# Patient Record
Sex: Female | Born: 1955
Health system: Southern US, Community
[De-identification: ages and names within clinical notes are randomized; demographics above are authoritative.]

## PROBLEM LIST (undated history)

## (undated) DIAGNOSIS — M810 Age-related osteoporosis without current pathological fracture: Secondary | ICD-10-CM

## (undated) DIAGNOSIS — M5136 Other intervertebral disc degeneration, lumbar region: Secondary | ICD-10-CM

## (undated) DIAGNOSIS — K222 Esophageal obstruction: Secondary | ICD-10-CM

## (undated) DIAGNOSIS — Z860100 Personal history of colon polyps, unspecified: Secondary | ICD-10-CM

## (undated) DIAGNOSIS — R569 Unspecified convulsions: Secondary | ICD-10-CM

## (undated) DIAGNOSIS — Z8673 Personal history of transient ischemic attack (TIA), and cerebral infarction without residual deficits: Secondary | ICD-10-CM

## (undated) DIAGNOSIS — E782 Mixed hyperlipidemia: Secondary | ICD-10-CM

## (undated) DIAGNOSIS — I2510 Atherosclerotic heart disease of native coronary artery without angina pectoris: Secondary | ICD-10-CM

## (undated) DIAGNOSIS — R1310 Dysphagia, unspecified: Secondary | ICD-10-CM

## (undated) DIAGNOSIS — F419 Anxiety disorder, unspecified: Secondary | ICD-10-CM

## (undated) DIAGNOSIS — C55 Malignant neoplasm of uterus, part unspecified: Secondary | ICD-10-CM

## (undated) DIAGNOSIS — M8580 Other specified disorders of bone density and structure, unspecified site: Secondary | ICD-10-CM

## (undated) DIAGNOSIS — C4491 Basal cell carcinoma of skin, unspecified: Secondary | ICD-10-CM

## (undated) DIAGNOSIS — I10 Essential (primary) hypertension: Secondary | ICD-10-CM

## (undated) DIAGNOSIS — K589 Irritable bowel syndrome without diarrhea: Secondary | ICD-10-CM

## (undated) DIAGNOSIS — N059 Unspecified nephritic syndrome with unspecified morphologic changes: Secondary | ICD-10-CM

## (undated) DIAGNOSIS — M8589 Other specified disorders of bone density and structure, multiple sites: Secondary | ICD-10-CM

## (undated) DIAGNOSIS — K219 Gastro-esophageal reflux disease without esophagitis: Secondary | ICD-10-CM

## (undated) DIAGNOSIS — M1990 Unspecified osteoarthritis, unspecified site: Secondary | ICD-10-CM

## (undated) DIAGNOSIS — M5030 Other cervical disc degeneration, unspecified cervical region: Secondary | ICD-10-CM

## (undated) DIAGNOSIS — M51369 Other intervertebral disc degeneration, lumbar region without mention of lumbar back pain or lower extremity pain: Secondary | ICD-10-CM

## (undated) HISTORY — DX: Anxiety disorder, unspecified: F41.9

## (undated) HISTORY — DX: Personal history of transient ischemic attack (TIA), and cerebral infarction without residual deficits: Z86.73

## (undated) HISTORY — DX: Esophageal obstruction: K22.2

## (undated) HISTORY — DX: Unspecified convulsions: R56.9

## (undated) HISTORY — PX: ABDOMINAL HYSTERECTOMY: SHX81

## (undated) HISTORY — DX: Other intervertebral disc degeneration, lumbar region: M51.36

## (undated) HISTORY — DX: Dysphagia, unspecified: R13.10

## (undated) HISTORY — DX: Irritable bowel syndrome, unspecified: K58.9

## (undated) HISTORY — DX: Gastro-esophageal reflux disease without esophagitis: K21.9

## (undated) HISTORY — DX: Basal cell carcinoma of skin, unspecified: C44.91

## (undated) HISTORY — DX: Unspecified osteoarthritis, unspecified site: M19.90

## (undated) HISTORY — DX: Personal history of colonic polyps: Z86.010

## (undated) HISTORY — DX: Age-related osteoporosis without current pathological fracture: M81.0

## (undated) HISTORY — PX: MELANOMA EXCISION: SHX5266

## (undated) HISTORY — DX: Other specified disorders of bone density and structure, unspecified site: M85.80

## (undated) HISTORY — DX: Mixed hyperlipidemia: E78.2

## (undated) HISTORY — PX: BACK SURGERY: SHX140

## (undated) HISTORY — DX: Personal history of colon polyps, unspecified: Z86.0100

## (undated) HISTORY — DX: Essential (primary) hypertension: I10

## (undated) HISTORY — DX: Other intervertebral disc degeneration, lumbar region without mention of lumbar back pain or lower extremity pain: M51.369

## (undated) HISTORY — PX: CHOLECYSTECTOMY: SHX55

## (undated) HISTORY — DX: Atherosclerotic heart disease of native coronary artery without angina pectoris: I25.10

## (undated) HISTORY — DX: Other cervical disc degeneration, unspecified cervical region: M50.30

## (undated) HISTORY — DX: Other specified disorders of bone density and structure, multiple sites: M85.89

## (undated) HISTORY — PX: BLADDER SURGERY: SHX569

## (undated) HISTORY — DX: Malignant neoplasm of uterus, part unspecified: C55

## (undated) HISTORY — DX: Unspecified nephritic syndrome with unspecified morphologic changes: N05.9

---

## 2007-10-14 HISTORY — PX: ESOPHAGOGASTRODUODENOSCOPY: SHX1529

## 2010-11-27 HISTORY — PX: NECK SURGERY: SHX720

## 2011-04-04 ENCOUNTER — Other Ambulatory Visit: Payer: Self-pay | Admitting: Neurosurgery

## 2011-04-04 ENCOUNTER — Ambulatory Visit
Admission: RE | Admit: 2011-04-04 | Discharge: 2011-04-04 | Disposition: A | Discharge status: Home / Self Care | Payer: PRIVATE HEALTH INSURANCE | Source: Ambulatory Visit | Attending: Neurosurgery | Admitting: Neurosurgery

## 2011-04-04 DIAGNOSIS — M542 Cervicalgia: Secondary | ICD-10-CM

## 2011-06-22 ENCOUNTER — Ambulatory Visit
Admission: RE | Admit: 2011-06-22 | Discharge: 2011-06-22 | Disposition: A | Discharge status: Home / Self Care | Payer: PRIVATE HEALTH INSURANCE | Source: Ambulatory Visit | Attending: Neurosurgery | Admitting: Neurosurgery

## 2011-06-22 ENCOUNTER — Other Ambulatory Visit: Payer: Self-pay | Admitting: Neurosurgery

## 2011-06-22 DIAGNOSIS — M542 Cervicalgia: Secondary | ICD-10-CM

## 2011-11-16 ENCOUNTER — Ambulatory Visit
Admission: RE | Admit: 2011-11-16 | Discharge: 2011-11-16 | Disposition: A | Discharge status: Home / Self Care | Payer: PRIVATE HEALTH INSURANCE | Source: Ambulatory Visit | Attending: Neurosurgery | Admitting: Neurosurgery

## 2011-11-16 ENCOUNTER — Other Ambulatory Visit: Payer: Self-pay | Admitting: Neurosurgery

## 2011-11-16 DIAGNOSIS — M542 Cervicalgia: Secondary | ICD-10-CM

## 2012-01-16 ENCOUNTER — Ambulatory Visit
Admission: RE | Admit: 2012-01-16 | Discharge: 2012-01-16 | Disposition: A | Discharge status: Home / Self Care | Payer: Commercial Managed Care - PPO | Source: Ambulatory Visit | Attending: Neurosurgery | Admitting: Neurosurgery

## 2012-01-16 DIAGNOSIS — M542 Cervicalgia: Secondary | ICD-10-CM

## 2012-04-16 ENCOUNTER — Other Ambulatory Visit: Payer: Self-pay | Admitting: Neurosurgery

## 2012-04-16 ENCOUNTER — Ambulatory Visit
Admission: RE | Admit: 2012-04-16 | Discharge: 2012-04-16 | Disposition: A | Discharge status: Home / Self Care | Payer: Commercial Managed Care - PPO | Source: Ambulatory Visit | Attending: Neurosurgery | Admitting: Neurosurgery

## 2012-04-16 DIAGNOSIS — M542 Cervicalgia: Secondary | ICD-10-CM

## 2012-04-16 DIAGNOSIS — M5030 Other cervical disc degeneration, unspecified cervical region: Secondary | ICD-10-CM

## 2012-07-06 IMAGING — CR DG CERVICAL SPINE 2 OR 3 VIEWS
4 series · 4 of 4 positions shown · non-contrast
Comparison: CT scan 01/16/2012.

CLINICAL DATA: Cervical fusion.

CERVICAL SPINE - 2-3 VIEW

[w c-spine lat (1 of 3)]
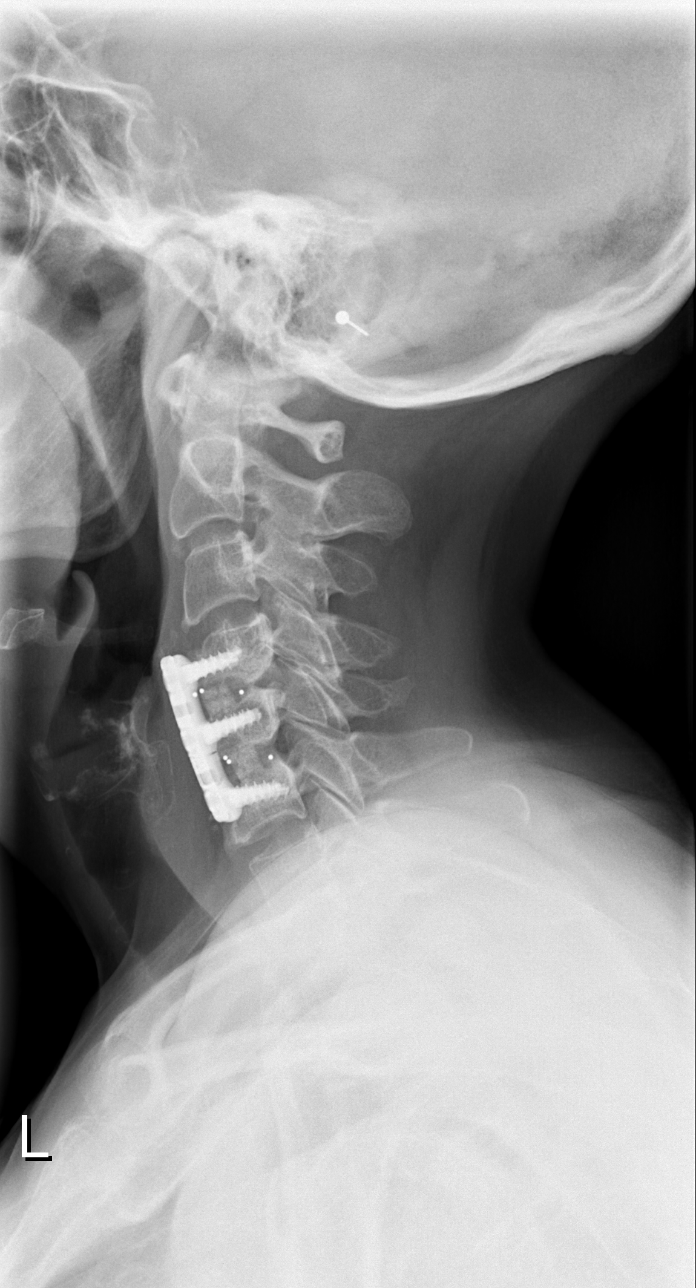

[w c-spine lat (2 of 3)]
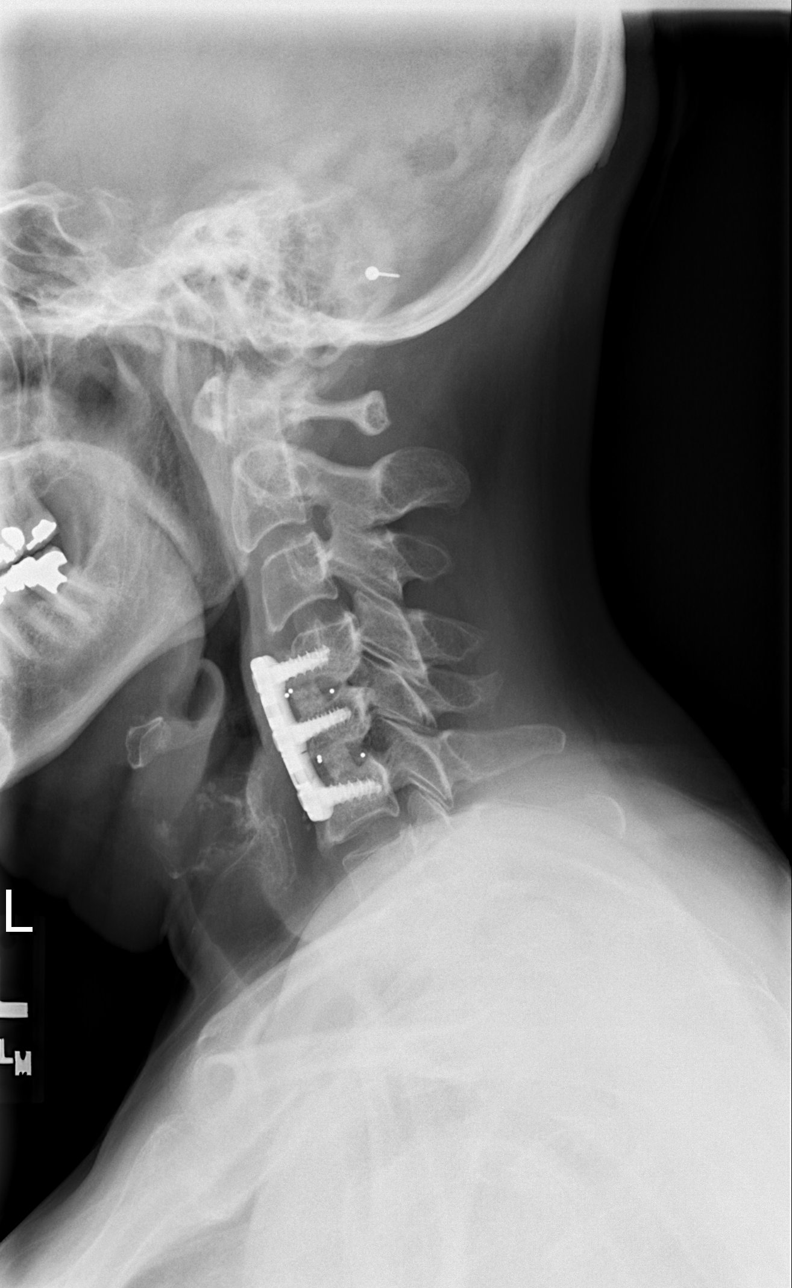

[w c-spine lat (3 of 3)]
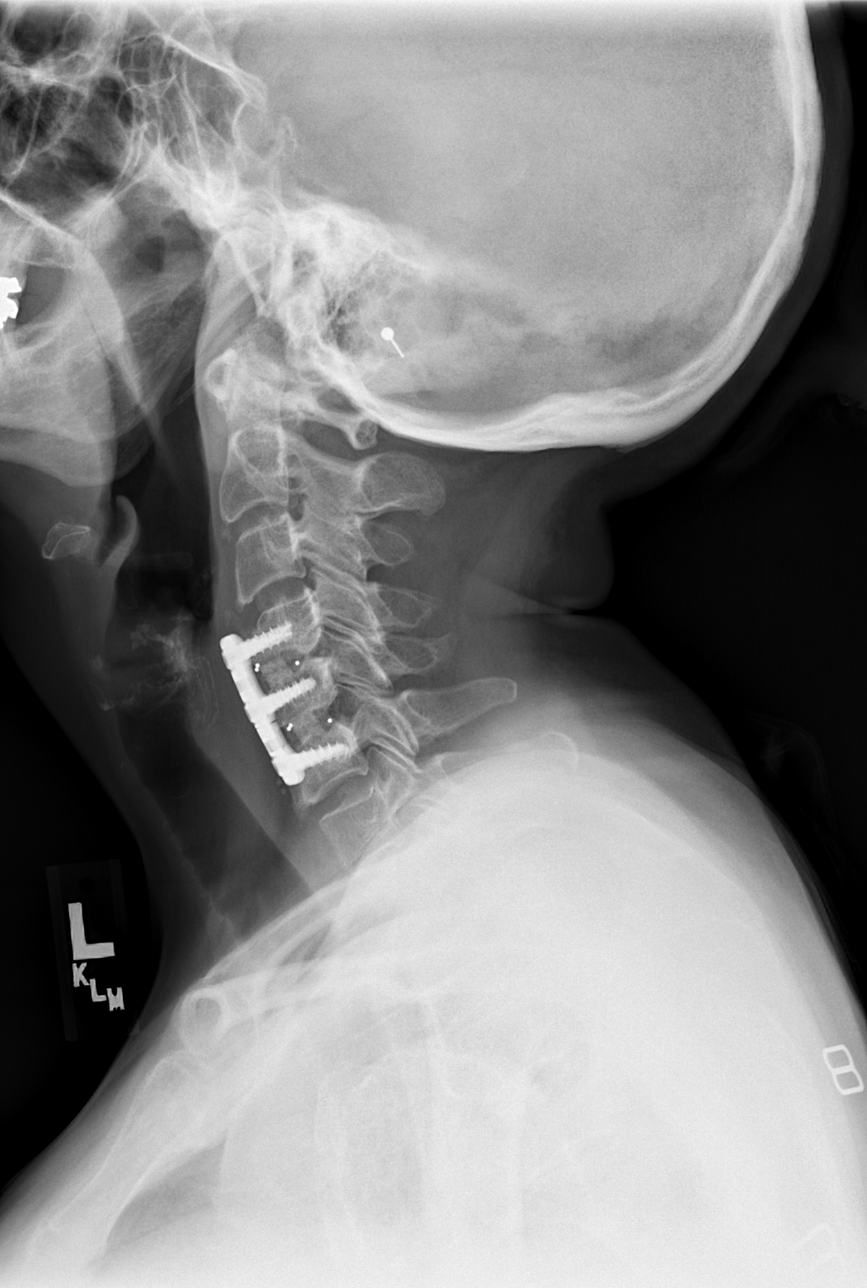

[w swimmers view *]
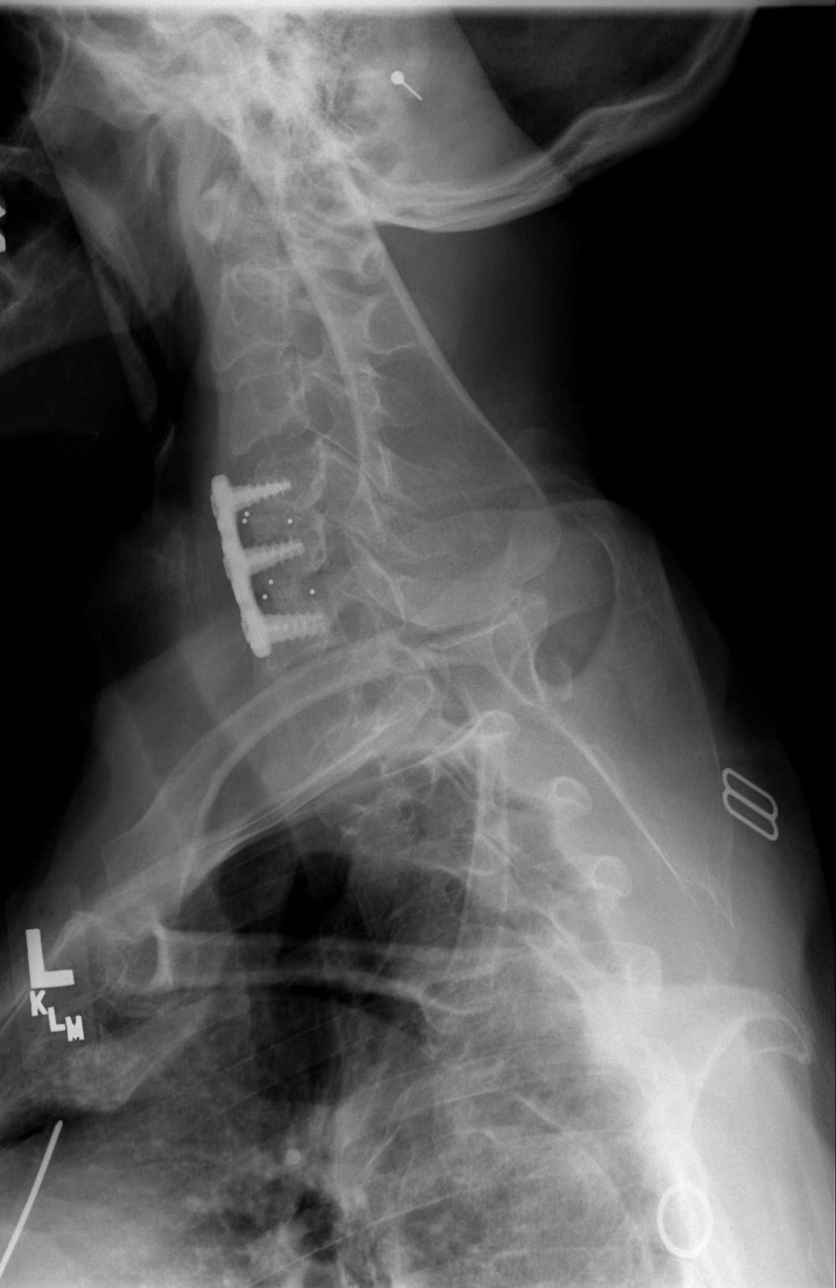

[4 of 4 positions shown; findings below may reference images not displayed]

FINDINGS: Anterior plate and screws and interbody bone spacers
fusing C4-5 and C5-6 appear stable.  No complicating features are
demonstrated.  The alignment is normal. Limited range of motion
with flexion/extension.  There is 2.5 mm of anterior subluxation of
C3 compared with C4 with flexion.  There is also slight widening of
the posterior disc space with flexion.
IMPRESSION: 1.  Stable fusion hardware.
2. Mild anterior subluxation of C3 compared with C4 with flexion.

## 2014-06-11 ENCOUNTER — Encounter: Payer: Self-pay | Admitting: Gastroenterology

## 2014-06-11 HISTORY — PX: COLONOSCOPY: SHX174

## 2016-05-24 DIAGNOSIS — Z85820 Personal history of malignant melanoma of skin: Secondary | ICD-10-CM | POA: Diagnosis not present

## 2016-05-24 DIAGNOSIS — Z08 Encounter for follow-up examination after completed treatment for malignant neoplasm: Secondary | ICD-10-CM | POA: Diagnosis not present

## 2016-05-24 DIAGNOSIS — L821 Other seborrheic keratosis: Secondary | ICD-10-CM | POA: Diagnosis not present

## 2016-05-24 DIAGNOSIS — D485 Neoplasm of uncertain behavior of skin: Secondary | ICD-10-CM | POA: Diagnosis not present

## 2016-09-26 DIAGNOSIS — M8589 Other specified disorders of bone density and structure, multiple sites: Secondary | ICD-10-CM | POA: Insufficient documentation

## 2016-09-26 DIAGNOSIS — Z8673 Personal history of transient ischemic attack (TIA), and cerebral infarction without residual deficits: Secondary | ICD-10-CM | POA: Insufficient documentation

## 2016-09-26 DIAGNOSIS — I10 Essential (primary) hypertension: Secondary | ICD-10-CM | POA: Insufficient documentation

## 2016-09-26 DIAGNOSIS — K2100 Gastro-esophageal reflux disease with esophagitis, without bleeding: Secondary | ICD-10-CM | POA: Insufficient documentation

## 2016-09-26 DIAGNOSIS — I6521 Occlusion and stenosis of right carotid artery: Secondary | ICD-10-CM | POA: Insufficient documentation

## 2016-09-27 DIAGNOSIS — Z0000 Encounter for general adult medical examination without abnormal findings: Secondary | ICD-10-CM | POA: Diagnosis not present

## 2016-09-27 DIAGNOSIS — Z01419 Encounter for gynecological examination (general) (routine) without abnormal findings: Secondary | ICD-10-CM | POA: Diagnosis not present

## 2016-09-27 DIAGNOSIS — Z1231 Encounter for screening mammogram for malignant neoplasm of breast: Secondary | ICD-10-CM | POA: Diagnosis not present

## 2016-09-27 DIAGNOSIS — E782 Mixed hyperlipidemia: Secondary | ICD-10-CM | POA: Diagnosis not present

## 2016-09-27 DIAGNOSIS — M8580 Other specified disorders of bone density and structure, unspecified site: Secondary | ICD-10-CM | POA: Diagnosis not present

## 2016-09-27 DIAGNOSIS — F419 Anxiety disorder, unspecified: Secondary | ICD-10-CM | POA: Diagnosis not present

## 2016-09-27 DIAGNOSIS — I10 Essential (primary) hypertension: Secondary | ICD-10-CM | POA: Diagnosis not present

## 2016-09-27 DIAGNOSIS — R5381 Other malaise: Secondary | ICD-10-CM | POA: Diagnosis not present

## 2016-09-27 DIAGNOSIS — K210 Gastro-esophageal reflux disease with esophagitis: Secondary | ICD-10-CM | POA: Diagnosis not present

## 2016-09-27 DIAGNOSIS — G459 Transient cerebral ischemic attack, unspecified: Secondary | ICD-10-CM | POA: Diagnosis not present

## 2016-09-27 DIAGNOSIS — I6523 Occlusion and stenosis of bilateral carotid arteries: Secondary | ICD-10-CM | POA: Diagnosis not present

## 2016-09-27 DIAGNOSIS — R5383 Other fatigue: Secondary | ICD-10-CM | POA: Diagnosis not present

## 2016-10-11 DIAGNOSIS — K210 Gastro-esophageal reflux disease with esophagitis: Secondary | ICD-10-CM | POA: Diagnosis not present

## 2016-10-11 DIAGNOSIS — Z1231 Encounter for screening mammogram for malignant neoplasm of breast: Secondary | ICD-10-CM | POA: Diagnosis not present

## 2016-10-11 DIAGNOSIS — K648 Other hemorrhoids: Secondary | ICD-10-CM | POA: Diagnosis not present

## 2016-10-11 DIAGNOSIS — I6523 Occlusion and stenosis of bilateral carotid arteries: Secondary | ICD-10-CM | POA: Diagnosis not present

## 2016-10-11 DIAGNOSIS — M791 Myalgia: Secondary | ICD-10-CM | POA: Diagnosis not present

## 2016-10-11 DIAGNOSIS — K921 Melena: Secondary | ICD-10-CM | POA: Diagnosis not present

## 2016-10-11 DIAGNOSIS — E782 Mixed hyperlipidemia: Secondary | ICD-10-CM | POA: Diagnosis not present

## 2016-11-13 DIAGNOSIS — J324 Chronic pansinusitis: Secondary | ICD-10-CM | POA: Diagnosis not present

## 2016-11-13 DIAGNOSIS — I10 Essential (primary) hypertension: Secondary | ICD-10-CM | POA: Diagnosis not present

## 2016-11-13 DIAGNOSIS — M79671 Pain in right foot: Secondary | ICD-10-CM | POA: Diagnosis not present

## 2016-11-14 DIAGNOSIS — M79671 Pain in right foot: Secondary | ICD-10-CM | POA: Diagnosis not present

## 2016-11-14 DIAGNOSIS — M7989 Other specified soft tissue disorders: Secondary | ICD-10-CM | POA: Diagnosis not present

## 2016-11-17 DIAGNOSIS — R609 Edema, unspecified: Secondary | ICD-10-CM | POA: Diagnosis not present

## 2016-11-17 DIAGNOSIS — M79671 Pain in right foot: Secondary | ICD-10-CM | POA: Diagnosis not present

## 2016-11-17 DIAGNOSIS — R600 Localized edema: Secondary | ICD-10-CM | POA: Diagnosis not present

## 2017-06-20 DIAGNOSIS — I10 Essential (primary) hypertension: Secondary | ICD-10-CM | POA: Diagnosis not present

## 2017-06-20 DIAGNOSIS — G459 Transient cerebral ischemic attack, unspecified: Secondary | ICD-10-CM | POA: Diagnosis not present

## 2017-06-20 DIAGNOSIS — I6523 Occlusion and stenosis of bilateral carotid arteries: Secondary | ICD-10-CM | POA: Diagnosis not present

## 2017-06-20 DIAGNOSIS — K210 Gastro-esophageal reflux disease with esophagitis: Secondary | ICD-10-CM | POA: Diagnosis not present

## 2017-08-01 DIAGNOSIS — Z85828 Personal history of other malignant neoplasm of skin: Secondary | ICD-10-CM | POA: Diagnosis not present

## 2017-08-01 DIAGNOSIS — Z85820 Personal history of malignant melanoma of skin: Secondary | ICD-10-CM | POA: Diagnosis not present

## 2017-08-01 DIAGNOSIS — Z08 Encounter for follow-up examination after completed treatment for malignant neoplasm: Secondary | ICD-10-CM | POA: Diagnosis not present

## 2017-10-23 DIAGNOSIS — J40 Bronchitis, not specified as acute or chronic: Secondary | ICD-10-CM | POA: Diagnosis not present

## 2017-10-23 DIAGNOSIS — I10 Essential (primary) hypertension: Secondary | ICD-10-CM | POA: Diagnosis not present

## 2017-11-22 DIAGNOSIS — J0191 Acute recurrent sinusitis, unspecified: Secondary | ICD-10-CM | POA: Diagnosis not present

## 2017-11-22 DIAGNOSIS — I6529 Occlusion and stenosis of unspecified carotid artery: Secondary | ICD-10-CM | POA: Diagnosis not present

## 2017-11-22 DIAGNOSIS — J310 Chronic rhinitis: Secondary | ICD-10-CM | POA: Diagnosis not present

## 2017-11-22 DIAGNOSIS — I10 Essential (primary) hypertension: Secondary | ICD-10-CM | POA: Diagnosis not present

## 2017-12-26 DIAGNOSIS — R5381 Other malaise: Secondary | ICD-10-CM | POA: Diagnosis not present

## 2017-12-26 DIAGNOSIS — Z124 Encounter for screening for malignant neoplasm of cervix: Secondary | ICD-10-CM | POA: Diagnosis not present

## 2017-12-26 DIAGNOSIS — R5383 Other fatigue: Secondary | ICD-10-CM | POA: Diagnosis not present

## 2017-12-26 DIAGNOSIS — K210 Gastro-esophageal reflux disease with esophagitis: Secondary | ICD-10-CM | POA: Diagnosis not present

## 2017-12-26 DIAGNOSIS — E782 Mixed hyperlipidemia: Secondary | ICD-10-CM | POA: Diagnosis not present

## 2017-12-26 DIAGNOSIS — Z0000 Encounter for general adult medical examination without abnormal findings: Secondary | ICD-10-CM | POA: Diagnosis not present

## 2017-12-26 DIAGNOSIS — I10 Essential (primary) hypertension: Secondary | ICD-10-CM | POA: Diagnosis not present

## 2017-12-26 DIAGNOSIS — I6529 Occlusion and stenosis of unspecified carotid artery: Secondary | ICD-10-CM | POA: Diagnosis not present

## 2018-02-06 DIAGNOSIS — M8580 Other specified disorders of bone density and structure, unspecified site: Secondary | ICD-10-CM | POA: Diagnosis not present

## 2018-02-06 DIAGNOSIS — M8589 Other specified disorders of bone density and structure, multiple sites: Secondary | ICD-10-CM | POA: Diagnosis not present

## 2018-02-06 DIAGNOSIS — Z1231 Encounter for screening mammogram for malignant neoplasm of breast: Secondary | ICD-10-CM | POA: Diagnosis not present

## 2018-02-06 DIAGNOSIS — Z780 Asymptomatic menopausal state: Secondary | ICD-10-CM | POA: Diagnosis not present

## 2018-08-14 DIAGNOSIS — Z85820 Personal history of malignant melanoma of skin: Secondary | ICD-10-CM | POA: Diagnosis not present

## 2018-08-14 DIAGNOSIS — L820 Inflamed seborrheic keratosis: Secondary | ICD-10-CM | POA: Diagnosis not present

## 2018-08-14 DIAGNOSIS — Z85828 Personal history of other malignant neoplasm of skin: Secondary | ICD-10-CM | POA: Diagnosis not present

## 2018-08-14 DIAGNOSIS — Z08 Encounter for follow-up examination after completed treatment for malignant neoplasm: Secondary | ICD-10-CM | POA: Diagnosis not present

## 2018-08-14 DIAGNOSIS — L700 Acne vulgaris: Secondary | ICD-10-CM | POA: Diagnosis not present

## 2018-09-04 DIAGNOSIS — K210 Gastro-esophageal reflux disease with esophagitis: Secondary | ICD-10-CM | POA: Diagnosis not present

## 2018-09-04 DIAGNOSIS — I6521 Occlusion and stenosis of right carotid artery: Secondary | ICD-10-CM | POA: Diagnosis not present

## 2018-09-04 DIAGNOSIS — R5381 Other malaise: Secondary | ICD-10-CM | POA: Diagnosis not present

## 2018-09-04 DIAGNOSIS — M791 Myalgia, unspecified site: Secondary | ICD-10-CM | POA: Diagnosis not present

## 2018-09-04 DIAGNOSIS — F419 Anxiety disorder, unspecified: Secondary | ICD-10-CM | POA: Diagnosis not present

## 2018-09-04 DIAGNOSIS — M5136 Other intervertebral disc degeneration, lumbar region: Secondary | ICD-10-CM | POA: Diagnosis not present

## 2018-09-04 DIAGNOSIS — E782 Mixed hyperlipidemia: Secondary | ICD-10-CM | POA: Diagnosis not present

## 2018-09-04 DIAGNOSIS — Z0000 Encounter for general adult medical examination without abnormal findings: Secondary | ICD-10-CM | POA: Diagnosis not present

## 2018-09-04 DIAGNOSIS — I10 Essential (primary) hypertension: Secondary | ICD-10-CM | POA: Diagnosis not present

## 2018-09-04 DIAGNOSIS — Z8673 Personal history of transient ischemic attack (TIA), and cerebral infarction without residual deficits: Secondary | ICD-10-CM | POA: Diagnosis not present

## 2018-09-04 DIAGNOSIS — M5030 Other cervical disc degeneration, unspecified cervical region: Secondary | ICD-10-CM | POA: Diagnosis not present

## 2018-09-04 DIAGNOSIS — R5383 Other fatigue: Secondary | ICD-10-CM | POA: Diagnosis not present

## 2018-09-04 DIAGNOSIS — Z780 Asymptomatic menopausal state: Secondary | ICD-10-CM | POA: Diagnosis not present

## 2018-09-04 DIAGNOSIS — M8589 Other specified disorders of bone density and structure, multiple sites: Secondary | ICD-10-CM | POA: Diagnosis not present

## 2019-07-16 ENCOUNTER — Encounter: Payer: Self-pay | Admitting: Gastroenterology

## 2019-08-27 DIAGNOSIS — Z85828 Personal history of other malignant neoplasm of skin: Secondary | ICD-10-CM | POA: Diagnosis not present

## 2019-08-27 DIAGNOSIS — L700 Acne vulgaris: Secondary | ICD-10-CM | POA: Diagnosis not present

## 2019-08-27 DIAGNOSIS — D485 Neoplasm of uncertain behavior of skin: Secondary | ICD-10-CM | POA: Diagnosis not present

## 2019-08-27 DIAGNOSIS — Z08 Encounter for follow-up examination after completed treatment for malignant neoplasm: Secondary | ICD-10-CM | POA: Diagnosis not present

## 2019-08-27 DIAGNOSIS — Z85820 Personal history of malignant melanoma of skin: Secondary | ICD-10-CM | POA: Diagnosis not present

## 2019-08-27 DIAGNOSIS — D2271 Melanocytic nevi of right lower limb, including hip: Secondary | ICD-10-CM | POA: Diagnosis not present

## 2019-09-10 DIAGNOSIS — M791 Myalgia, unspecified site: Secondary | ICD-10-CM | POA: Diagnosis not present

## 2019-09-10 DIAGNOSIS — K2100 Gastro-esophageal reflux disease with esophagitis, without bleeding: Secondary | ICD-10-CM | POA: Diagnosis not present

## 2019-09-10 DIAGNOSIS — R5381 Other malaise: Secondary | ICD-10-CM | POA: Diagnosis not present

## 2019-09-10 DIAGNOSIS — I6521 Occlusion and stenosis of right carotid artery: Secondary | ICD-10-CM | POA: Diagnosis not present

## 2019-09-10 DIAGNOSIS — F419 Anxiety disorder, unspecified: Secondary | ICD-10-CM | POA: Diagnosis not present

## 2019-09-10 DIAGNOSIS — M8589 Other specified disorders of bone density and structure, multiple sites: Secondary | ICD-10-CM | POA: Diagnosis not present

## 2019-09-10 DIAGNOSIS — Z23 Encounter for immunization: Secondary | ICD-10-CM | POA: Diagnosis not present

## 2019-09-10 DIAGNOSIS — M5030 Other cervical disc degeneration, unspecified cervical region: Secondary | ICD-10-CM | POA: Diagnosis not present

## 2019-09-10 DIAGNOSIS — R5383 Other fatigue: Secondary | ICD-10-CM | POA: Diagnosis not present

## 2019-09-10 DIAGNOSIS — M5136 Other intervertebral disc degeneration, lumbar region: Secondary | ICD-10-CM | POA: Diagnosis not present

## 2019-09-10 DIAGNOSIS — E782 Mixed hyperlipidemia: Secondary | ICD-10-CM | POA: Diagnosis not present

## 2019-09-10 DIAGNOSIS — Z0000 Encounter for general adult medical examination without abnormal findings: Secondary | ICD-10-CM | POA: Diagnosis not present

## 2019-09-10 DIAGNOSIS — Z8673 Personal history of transient ischemic attack (TIA), and cerebral infarction without residual deficits: Secondary | ICD-10-CM | POA: Diagnosis not present

## 2019-09-10 DIAGNOSIS — I10 Essential (primary) hypertension: Secondary | ICD-10-CM | POA: Diagnosis not present

## 2019-09-12 DIAGNOSIS — Z23 Encounter for immunization: Secondary | ICD-10-CM | POA: Diagnosis not present

## 2020-02-09 DIAGNOSIS — F419 Anxiety disorder, unspecified: Secondary | ICD-10-CM | POA: Diagnosis not present

## 2020-02-09 DIAGNOSIS — K2100 Gastro-esophageal reflux disease with esophagitis, without bleeding: Secondary | ICD-10-CM | POA: Diagnosis not present

## 2020-02-09 DIAGNOSIS — I10 Essential (primary) hypertension: Secondary | ICD-10-CM | POA: Diagnosis not present

## 2020-02-09 DIAGNOSIS — J3489 Other specified disorders of nose and nasal sinuses: Secondary | ICD-10-CM | POA: Diagnosis not present

## 2020-02-11 ENCOUNTER — Encounter: Payer: Self-pay | Admitting: Gastroenterology

## 2020-03-23 ENCOUNTER — Other Ambulatory Visit: Payer: Self-pay

## 2020-03-23 ENCOUNTER — Ambulatory Visit: Payer: Commercial Managed Care - PPO | Admitting: Gastroenterology

## 2020-03-23 ENCOUNTER — Encounter: Payer: Self-pay | Admitting: Gastroenterology

## 2020-03-23 VITALS — BP 148/92 | HR 65 | Temp 97.8°F | Ht 62.0 in | Wt 145.5 lb

## 2020-03-23 DIAGNOSIS — Z86010 Personal history of colonic polyps: Secondary | ICD-10-CM

## 2020-03-23 DIAGNOSIS — K625 Hemorrhage of anus and rectum: Secondary | ICD-10-CM

## 2020-03-23 DIAGNOSIS — R1310 Dysphagia, unspecified: Secondary | ICD-10-CM

## 2020-03-23 DIAGNOSIS — R197 Diarrhea, unspecified: Secondary | ICD-10-CM

## 2020-03-23 DIAGNOSIS — R1319 Other dysphagia: Secondary | ICD-10-CM

## 2020-03-23 NOTE — Progress Notes (Signed)
Chief Complaint: GERD/dysphagia/intermittent diarrhea  Referring Provider:  Raina Mina., MD      ASSESSMENT AND PLAN;   #1.  GERD with HH with eso dysphagia  #2.  IBS-D with occ rectal bleeding. Nl CBC. Has associated post-cholecystectomy diarrhea  #3.  Previous history of colonic polyps.  Plan: -EGD with dilatation/colonoscopy for further evaluation of discussed risks and benefits. -We will continue omeprazole 40 mg p.o. every morning. -Continue Pepcid on a as needed basis. -Nonpharmacologic means of reflux control was stressed. -If the diarrhea persists, will give her a trial of cholestyramine.   HPI:    Kathleen Mayo is a 64 y.o. female  With dysphagia mostly to solids, getting worse over the last 6 months.  She will be having increased acid reflux.  Has been taking omeprazole 40 mg p.o. once a day.  Seen by Blanch Media, added Pepcid with good results.  Also complains of intermittent diarrhea which at times gets worse. 4-6/day, postprandial without nocturnal symptoms.  No melena.  Had occasional hematochezia mostly when she wipes on the tissue paper.  Denies having any significant abdominal pain.  No fever chills or night sweats no recent weight loss.  Has "metal plate" in the neck and was concerned if that is causing problems.  She also has chronic back pain due to severe osteoarthritis and is contemplating back surgery.  No sodas, chocolates, chewing gums, artificial sweeteners and candy. No NSAIDs   Past GI procedures: -Colon 05/2014 ([facility]-some difficulty through the sigmoid colon) mild sigmoid diverticulosis otherwise normal to TI. 2010-tubular adenomas. -EGD 09/2007 Schatzki's ring s/p dilatation 5044, mild gastritis, irregular Z-line. Neg Bx for EoE/HP  Past Medical History:  Diagnosis Date  . Arthritis   . Basal cell carcinoma   . CAD (coronary artery disease)   . DDD (degenerative disc disease), cervical   . DDD (degenerative disc disease), lumbar   .  Dysphagia   . Essential hypertension   . GERD (gastroesophageal reflux disease)   . History of colon polyps   . History of TIA (transient ischemic attack)   . IBS (irritable bowel syndrome)   . Mixed hyperlipidemia   . Osteopenia   . Osteopenia of multiple sites   . Schatzki's ring   . Seizures ([place])   . Uterine cancer [facility])     Past Surgical History:  Procedure Laterality Date  . ABDOMINAL HYSTERECTOMY     age 56  . BACK SURGERY     2 protruding disc and 6 spurs  . BLADDER SURGERY     x2  . CHOLECYSTECTOMY    . COLONOSCOPY  06/11/2014   Mild diverticulosis. Otherwise normal colonoscopy  . ESOPHAGOGASTRODUODENOSCOPY  10/14/2007   Sxhatzki ring status post esophageal dilatation. Mildy irregular Z-Line suggestive of gastroesophageal reflux (biopsied). Mild gastritis.   Marland Kitchen NECK SURGERY  2012   has a metal plate in neck with scar tissue    Family History  Problem Relation Age of Onset  . Breast cancer Mother        2 different kinds per patient  . Pancreatic cancer Cousin   . Esophageal cancer Neg Hx     Social History   Tobacco Use  . Smoking status: Former Research scientist (life sciences)  . Smokeless tobacco: Never Used  Substance Use Topics  . Alcohol use: Not Currently  . Drug use: Not Currently    Current Outpatient Medications  Medication Sig Dispense Refill  . Ascorbic Acid (VITAMIN C PO) Take 1 tablet by mouth daily.    Marland Kitchen  BIOTIN PO Take 1 tablet by mouth daily.    . calcium carbonate (OSCAL) 1500 (600 Ca) MG TABS tablet Take 1,200 mg of elemental calcium by mouth daily.    . famotidine (PEPCID) 20 MG tablet Take 1 tablet by mouth daily.    . hyoscyamine (LEVSIN SL) 0.125 MG SL tablet Take 1 tablet by mouth every 4 (four) hours as needed.    Marland Kitchen losartan (COZAAR) 25 MG tablet 1 tablet daily.    . Multiple Vitamin (MULTIVITAMIN) capsule Take 1 capsule by mouth daily.    . Multiple Vitamins-Minerals (ICAPS) CAPS Take 1 capsule by mouth daily.    Marland Kitchen omeprazole (PRILOSEC) 40 MG capsule  Take 40 mg by mouth daily.    . simvastatin (ZOCOR) 10 MG tablet 1 tablet as directed. 1 to 3 times a week     No current facility-administered medications for this visit.    Allergies  Allergen Reactions  . Sulfamethoxazole Anaphylaxis    Review of Systems:  Constitutional: Denies fever, chills, diaphoresis, appetite change and fatigue.  HEENT: Denies photophobia, eye pain, redness, hearing loss, ear pain, congestion, sore throat, rhinorrhea, sneezing, mouth sores, neck pain, neck stiffness and tinnitus.   Respiratory: Denies SOB, DOE, cough, chest tightness,  and wheezing.   Cardiovascular: Denies chest pain, palpitations and leg swelling.  Genitourinary: Denies dysuria, urgency, frequency, hematuria, flank pain and difficulty urinating.  Musculoskeletal: Has myalgias, back pain, joint swelling, arthralgias and gait problem.  Skin: No rash.  Neurological: Denies dizziness, seizures, syncope, weakness, light-headedness, numbness and headaches.  Hematological: Denies adenopathy. Easy bruising, personal or family bleeding history  Psychiatric/Behavioral: Has anxiety or depression     Physical Exam:    BP (!) 148/92   Pulse 65   Temp 97.8 F (36.6 C)   Ht 5\' 2"  (1.575 m)   Wt 145 lb 8 oz (66 kg)   BMI 26.61 kg/m  Wt Readings from Last 3 Encounters:  03/23/20 145 lb 8 oz (66 kg)   Constitutional:  Well-developed, in no acute distress. Psychiatric: Normal mood and affect. Behavior is normal. HEENT: Pupils normal.  Conjunctivae are normal. No scleral icterus. Neck supple.  Cardiovascular: Normal rate, regular rhythm. No edema Pulmonary/chest: Effort normal and bilateral breath sounds are reduced.. No wheezing, rales or rhonchi. Abdominal: Soft, nondistended. Nontender. Bowel sounds active throughout. There are no masses palpable. No hepatomegaly. Rectal:  defered Neurological: Alert and oriented to person place and time. Skin: Skin is warm and dry. No rashes noted.  Data  Reviewed: I have personally reviewed following labs and imaging studies    Carmell Austria, MD 03/23/2020, 2:04 PM  Cc: Raina Mina., MD

## 2020-03-23 NOTE — Patient Instructions (Addendum)
If you are age 64 or older, your body mass index should be between 23-30. Your Body mass index is 26.61 kg/m. If this is out of the aforementioned range listed, please consider follow up with your Primary Care Provider.  If you are age 64 or younger, your body mass index should be between 19-25. Your Body mass index is 26.61 kg/m. If this is out of the aformentioned range listed, please consider follow up with your Primary Care Provider.   It has been recommended to you by your physician that you have a(n) Colonoscopy/EGD completed. Per your request, we did not schedule the procedure(s) today. Please contact our office at [phone] should you decide to have the procedure completed. You will be scheduled for a pre-visit and procedure at that time.   Thank you,  Dr. Jackquline Denmark

## 2020-03-26 ENCOUNTER — Encounter: Payer: Self-pay | Admitting: Gastroenterology

## 2020-04-22 ENCOUNTER — Other Ambulatory Visit: Payer: Self-pay

## 2020-04-22 ENCOUNTER — Ambulatory Visit (AMBULATORY_SURGERY_CENTER): Payer: Self-pay | Admitting: *Deleted

## 2020-04-22 VITALS — Ht 62.0 in | Wt 144.0 lb

## 2020-04-22 DIAGNOSIS — Z86010 Personal history of colonic polyps: Secondary | ICD-10-CM

## 2020-04-22 DIAGNOSIS — K209 Esophagitis, unspecified without bleeding: Secondary | ICD-10-CM

## 2020-04-22 NOTE — Progress Notes (Signed)

## 2020-04-28 ENCOUNTER — Encounter: Payer: Self-pay | Admitting: Gastroenterology

## 2020-05-05 DIAGNOSIS — M8589 Other specified disorders of bone density and structure, multiple sites: Secondary | ICD-10-CM | POA: Diagnosis not present

## 2020-05-05 DIAGNOSIS — Z1231 Encounter for screening mammogram for malignant neoplasm of breast: Secondary | ICD-10-CM | POA: Diagnosis not present

## 2020-05-10 ENCOUNTER — Encounter: Payer: PPO | Admitting: Gastroenterology

## 2020-05-10 ENCOUNTER — Other Ambulatory Visit: Payer: Self-pay

## 2020-05-10 ENCOUNTER — Encounter: Payer: Self-pay | Admitting: Gastroenterology

## 2020-05-10 ENCOUNTER — Ambulatory Visit (AMBULATORY_SURGERY_CENTER): Payer: PPO | Admitting: Gastroenterology

## 2020-05-10 VITALS — BP 149/82 | HR 47 | Temp 97.1°F | Resp 19 | Ht 62.0 in | Wt 144.0 lb

## 2020-05-10 DIAGNOSIS — R1310 Dysphagia, unspecified: Secondary | ICD-10-CM | POA: Diagnosis not present

## 2020-05-10 DIAGNOSIS — K2970 Gastritis, unspecified, without bleeding: Secondary | ICD-10-CM

## 2020-05-10 DIAGNOSIS — R1319 Other dysphagia: Secondary | ICD-10-CM

## 2020-05-10 DIAGNOSIS — Z86010 Personal history of colonic polyps: Secondary | ICD-10-CM

## 2020-05-10 DIAGNOSIS — K219 Gastro-esophageal reflux disease without esophagitis: Secondary | ICD-10-CM

## 2020-05-10 DIAGNOSIS — K2990 Gastroduodenitis, unspecified, without bleeding: Secondary | ICD-10-CM

## 2020-05-10 DIAGNOSIS — D122 Benign neoplasm of ascending colon: Secondary | ICD-10-CM | POA: Diagnosis not present

## 2020-05-10 DIAGNOSIS — K222 Esophageal obstruction: Secondary | ICD-10-CM | POA: Diagnosis not present

## 2020-05-10 DIAGNOSIS — K317 Polyp of stomach and duodenum: Secondary | ICD-10-CM | POA: Diagnosis not present

## 2020-05-10 DIAGNOSIS — K209 Esophagitis, unspecified without bleeding: Secondary | ICD-10-CM

## 2020-05-10 DIAGNOSIS — K259 Gastric ulcer, unspecified as acute or chronic, without hemorrhage or perforation: Secondary | ICD-10-CM | POA: Diagnosis not present

## 2020-05-10 MED ORDER — SODIUM CHLORIDE 0.9 % IV SOLN: 500.0000 mL | Freq: Once | INTRAVENOUS | Status: DC

## 2020-05-10 NOTE — Op Note (Signed)
[facility] Patient Name: Kathleen Mayo Procedure Date: 05/10/2020 8:36 AM MRN: [id number] Endoscopist: Jackquline Denmark , MD Age: 64 Referring MD:  Date of Birth: 1956-05-18 Gender: Female Account #: [id number] Procedure:                Colonoscopy Indications:              High risk colon cancer surveillance: Personal                            history of colonic polyps Medicines:                Monitored Anesthesia Care Procedure:                Pre-Anesthesia Assessment:                           - Prior to the procedure, a History and Physical                            was performed, and patient medications and                            allergies were reviewed. The patient's tolerance of                            previous anesthesia was also reviewed. The risks                            and benefits of the procedure and the sedation                            options and risks were discussed with the patient.                            All questions were answered, and informed consent                            was obtained. Prior Anticoagulants: The patient has                            taken no previous anticoagulant or antiplatelet                            agents. ASA Grade Assessment: III - A patient with                            severe systemic disease. After reviewing the risks                            and benefits, the patient was deemed in                            satisfactory condition to undergo the procedure.  After obtaining informed consent, the colonoscope                            was passed under direct vision. Throughout the                            procedure, the patient's blood pressure, pulse, and                            oxygen saturations were monitored continuously. The                            Colonoscope was introduced through the anus and                            advanced to the the cecum, identified  by                            appendiceal orifice and ileocecal valve. The                            colonoscopy was performed without difficulty. The                            patient tolerated the procedure well. The quality                            of the bowel preparation was good except in the                            right side of the colon where there was some                            adherent stool. The ileocecal valve, appendiceal                            orifice, and rectum were photographed. Scope In: 8:59:16 AM Scope Out: 9:17:02 AM Scope Withdrawal Time: 0 hours 10 minutes 51 seconds  Total Procedure Duration: 0 hours 17 minutes 46 seconds  Findings:                 A 6 mm polyp was found in the proximal ascending                            colon. The polyp was sessile. The polyp was removed                            with a cold snare. Resection and retrieval were                            complete.                           A few small-mouthed rare diverticula were found  in                            the sigmoid colon.                           Non-bleeding internal hemorrhoids were found during                            retroflexion. The hemorrhoids were small.                           The exam was otherwise without abnormality on                            direct and retroflexion views. Complications:            No immediate complications. Estimated Blood Loss:     Estimated blood loss: none. Impression:               - One 6 mm polyp in the proximal ascending colon,                            removed with a cold snare. Resected and retrieved.                           - Minimal sigmoid diverticulosis.                           - Non-bleeding internal hemorrhoids.                           - The examination was otherwise normal on direct                            and retroflexion views. Recommendation:           - Patient has a contact number available for                             emergencies. The signs and symptoms of potential                            delayed complications were discussed with the                            patient. Return to normal activities tomorrow.                            Written discharge instructions were provided to the                            patient.                           - Resume previous diet.                           -  Continue present medications.                           - Await pathology results.                           - Repeat colonoscopy for surveillance based on                            pathology results.                           - The findings and recommendations were discussed                            with the patient's brother Heath Lark. Jackquline Denmark, MD 05/10/2020 9:27:53 AM This report has been signed electronically.

## 2020-05-10 NOTE — Op Note (Signed)
[facility] Patient Name: Kathleen Mayo Procedure Date: 05/10/2020 8:36 AM MRN: [id number] Endoscopist: Jackquline Denmark , MD Age: 64 Referring MD:  Date of Birth: 10-16-1956 Gender: Female Account #: [id number] Procedure:                Upper GI endoscopy Indications:              Dysphagia Medicines:                Monitored Anesthesia Care Procedure:                Pre-Anesthesia Assessment:                           - Prior to the procedure, a History and Physical                            was performed, and patient medications and                            allergies were reviewed. The patient's tolerance of                            previous anesthesia was also reviewed. The risks                            and benefits of the procedure and the sedation                            options and risks were discussed with the patient.                            All questions were answered, and informed consent                            was obtained. Prior Anticoagulants: The patient has                            taken no previous anticoagulant or antiplatelet                            agents. ASA Grade Assessment: III - A patient with                            severe systemic disease. After reviewing the risks                            and benefits, the patient was deemed in                            satisfactory condition to undergo the procedure.                           After obtaining informed consent, the endoscope was  passed under direct vision. Throughout the                            procedure, the patient's blood pressure, pulse, and                            oxygen saturations were monitored continuously. The                            Endoscope was introduced through the mouth, and                            advanced to the second part of duodenum. The upper                            GI endoscopy was accomplished without  difficulty.                            The patient tolerated the procedure well. Scope In: Scope Out: Findings:                 A mild Schatzki ring was found at the                            gastroesophageal junction, 35 cm from the incisors.                            Biopsies were obtained from the proximal and distal                            esophagus with cold forceps for histology of                            suspected eosinophilic esophagitis. The scope was                            withdrawn. Dilation was performed with a Maloney                            dilator with no resistance at 50 Fr.                           A small hiatal hernia was present.                           Localized mild inflammation characterized by                            erythema was found in the gastric body and in the                            gastric antrum. Biopsies were taken with a cold  forceps for histology.                           Two 2 to 4 mm sessile polyps with no bleeding and                            no stigmata of recent bleeding were found in the                            gastric body. The polyp was removed with a cold                            biopsy forceps. Resection and retrieval were                            complete.                           The examined duodenum was normal. Complications:            No immediate complications. Estimated Blood Loss:     Estimated blood loss: none. Impression:               - Mild Schatzki ring. Biopsied. Dilated.                           - Small hiatal hernia.                           - Gastritis. Biopsied.                           - Two gastric polyps. Resected and retrieved.                           - Normal examined duodenum. Recommendation:           - Patient has a contact number available for                            emergencies. The signs and symptoms of potential                             delayed complications were discussed with the                            patient. Return to normal activities tomorrow.                            Written discharge instructions were provided to the                            patient.                           - Post dilatation diet.                           -  Continue present medications.                           - Await pathology results.                           - The findings and recommendations were discussed                            with the patient's family. Jackquline Denmark, MD 05/10/2020 9:24:27 AM This report has been signed electronically.

## 2020-05-10 NOTE — Progress Notes (Signed)
Pt's states no medical or surgical changes since previsit or office visit.  Temp- June Vitals- Courtney 

## 2020-05-10 NOTE — Progress Notes (Signed)
VO Lidocaine 2% 61mL given with propofol for comfort.pt tolerated well. VSS. awake and to recovery. Report given to RN.

## 2020-05-10 NOTE — Patient Instructions (Signed)
HANDOUTS PROVIDED ON: POST DILATION DIET, HIATAL HERNIA, GASTRITIS, POLYPS, DIVERTICULOSIS, & HEMORRHOIDS  The polyps removed/biopsies taken today have been sent for pathology.  The results can take 1-3 weeks to receive.  When your next colonoscopy should occur will be based on the pathology results.    You may resume your previous medication schedule.  Thank you for allowing [place] to care for you today!!!   YOU HAD AN ENDOSCOPIC PROCEDURE TODAY AT [facility]:   Refer to the procedure report that was given to you for any specific questions about what was found during the examination.  If the procedure report does not answer your questions, please call your gastroenterologist to clarify.  If you requested that your care partner not be given the details of your procedure findings, then the procedure report has been included in a sealed envelope for you to review at your convenience later.  YOU SHOULD EXPECT: Some feelings of bloating in the abdomen. Passage of more gas than usual.  Walking can help get rid of the air that was put into your GI tract during the procedure and reduce the bloating. If you had a lower endoscopy (such as a colonoscopy or flexible sigmoidoscopy) you may notice spotting of blood in your stool or on the toilet paper. If you underwent a bowel prep for your procedure, you may not have a normal bowel movement for a few days.  Please Note:  You might notice some irritation and congestion in your nose or some drainage.  This is from the oxygen used during your procedure.  There is no need for concern and it should clear up in a day or so.  SYMPTOMS TO REPORT IMMEDIATELY:   Following lower endoscopy (colonoscopy or flexible sigmoidoscopy):  Excessive amounts of blood in the stool  Significant tenderness or worsening of abdominal pains  Swelling of the abdomen that is new, acute  Fever of 100F or higher   Following upper endoscopy (EGD)  Vomiting of blood or  coffee ground material  New chest pain or pain under the shoulder blades  Painful or persistently difficult swallowing  New shortness of breath  Fever of 100F or higher  Black, tarry-looking stools  For urgent or emergent issues, a gastroenterologist can be reached at any hour by calling [phone]. Do not use MyChart messaging for urgent concerns.    DIET:  We do recommend to follow the post dilation diet for today and tomorrow start with a small meal at first, but then you may proceed to your regular diet.  Drink plenty of fluids but you should avoid alcoholic beverages for 24 hours.  ACTIVITY:  You should plan to take it easy for the rest of today and you should NOT DRIVE or use heavy machinery until tomorrow (because of the sedation medicines used during the test).    FOLLOW UP: Our staff will call the number listed on your records 48-72 hours following your procedure to check on you and address any questions or concerns that you may have regarding the information given to you following your procedure. If we do not reach you, we will leave a message.  We will attempt to reach you two times.  During this call, we will ask if you have developed any symptoms of COVID 19. If you develop any symptoms (ie: fever, flu-like symptoms, shortness of breath, cough etc.) before then, please call [phone].  If you test positive for Covid 19 in the 2 weeks post procedure,  please call and report this information to [place].    If any biopsies were taken you will be contacted by phone or by letter within the next 1-3 weeks.  Please call us at [phone] if you have not heard about the biopsies in 3 weeks.    SIGNATURES/CONFIDENTIALITY: You and/or your care partner have signed paperwork which will be entered into your electronic medical record.  These signatures attest to the fact that that the information above on your After Visit Summary has been reviewed and is understood.  Full responsibility of  the confidentiality of this discharge information lies with you and/or your care-partner.

## 2020-05-12 ENCOUNTER — Telehealth: Payer: Self-pay | Admitting: *Deleted

## 2020-05-12 ENCOUNTER — Telehealth: Payer: Self-pay

## 2020-05-12 NOTE — Telephone Encounter (Signed)
  Follow up Call-  Call back number 05/10/2020  Post procedure Call Back phone  # [phone]  Permission to leave phone message Yes  Some recent data might be hidden     Attempted to call but no answer and no voicemail

## 2020-05-12 NOTE — Telephone Encounter (Signed)
  Follow up Call-  Call back number 05/10/2020  Post procedure Call Back phone  # [phone]  Permission to leave phone message Yes  Some recent data might be hidden    No answer and no machine to leave a message

## 2020-05-13 ENCOUNTER — Encounter: Payer: Self-pay | Admitting: Gastroenterology

## 2020-06-16 DIAGNOSIS — N390 Urinary tract infection, site not specified: Secondary | ICD-10-CM | POA: Diagnosis not present

## 2020-06-16 DIAGNOSIS — R300 Dysuria: Secondary | ICD-10-CM | POA: Diagnosis not present

## 2020-06-28 DIAGNOSIS — R310 Gross hematuria: Secondary | ICD-10-CM | POA: Diagnosis not present

## 2020-06-28 DIAGNOSIS — I700 Atherosclerosis of aorta: Secondary | ICD-10-CM | POA: Diagnosis not present

## 2020-06-28 DIAGNOSIS — R109 Unspecified abdominal pain: Secondary | ICD-10-CM | POA: Diagnosis not present

## 2020-06-28 DIAGNOSIS — R319 Hematuria, unspecified: Secondary | ICD-10-CM | POA: Diagnosis not present

## 2020-06-28 DIAGNOSIS — R300 Dysuria: Secondary | ICD-10-CM | POA: Diagnosis not present

## 2020-06-28 DIAGNOSIS — R3915 Urgency of urination: Secondary | ICD-10-CM | POA: Diagnosis not present

## 2020-09-14 DIAGNOSIS — Z0000 Encounter for general adult medical examination without abnormal findings: Secondary | ICD-10-CM | POA: Diagnosis not present

## 2020-09-14 DIAGNOSIS — R5381 Other malaise: Secondary | ICD-10-CM | POA: Diagnosis not present

## 2020-09-14 DIAGNOSIS — Z8673 Personal history of transient ischemic attack (TIA), and cerebral infarction without residual deficits: Secondary | ICD-10-CM | POA: Diagnosis not present

## 2020-09-14 DIAGNOSIS — K648 Other hemorrhoids: Secondary | ICD-10-CM | POA: Diagnosis not present

## 2020-09-14 DIAGNOSIS — M5136 Other intervertebral disc degeneration, lumbar region: Secondary | ICD-10-CM | POA: Diagnosis not present

## 2020-09-14 DIAGNOSIS — R5383 Other fatigue: Secondary | ICD-10-CM | POA: Diagnosis not present

## 2020-09-14 DIAGNOSIS — M5030 Other cervical disc degeneration, unspecified cervical region: Secondary | ICD-10-CM | POA: Diagnosis not present

## 2020-09-14 DIAGNOSIS — M791 Myalgia, unspecified site: Secondary | ICD-10-CM | POA: Diagnosis not present

## 2020-09-14 DIAGNOSIS — M8589 Other specified disorders of bone density and structure, multiple sites: Secondary | ICD-10-CM | POA: Diagnosis not present

## 2020-09-14 DIAGNOSIS — I10 Essential (primary) hypertension: Secondary | ICD-10-CM | POA: Diagnosis not present

## 2020-09-14 DIAGNOSIS — K2100 Gastro-esophageal reflux disease with esophagitis, without bleeding: Secondary | ICD-10-CM | POA: Diagnosis not present

## 2020-09-14 DIAGNOSIS — F419 Anxiety disorder, unspecified: Secondary | ICD-10-CM | POA: Diagnosis not present

## 2020-09-14 DIAGNOSIS — E782 Mixed hyperlipidemia: Secondary | ICD-10-CM | POA: Diagnosis not present

## 2020-09-14 DIAGNOSIS — R1084 Generalized abdominal pain: Secondary | ICD-10-CM | POA: Diagnosis not present

## 2020-09-14 DIAGNOSIS — I6521 Occlusion and stenosis of right carotid artery: Secondary | ICD-10-CM | POA: Diagnosis not present

## 2020-11-08 DIAGNOSIS — J069 Acute upper respiratory infection, unspecified: Secondary | ICD-10-CM | POA: Diagnosis not present

## 2020-11-15 DIAGNOSIS — D2272 Melanocytic nevi of left lower limb, including hip: Secondary | ICD-10-CM | POA: Diagnosis not present

## 2020-11-15 DIAGNOSIS — D1801 Hemangioma of skin and subcutaneous tissue: Secondary | ICD-10-CM | POA: Diagnosis not present

## 2020-11-15 DIAGNOSIS — L700 Acne vulgaris: Secondary | ICD-10-CM | POA: Diagnosis not present

## 2020-11-15 DIAGNOSIS — Z85820 Personal history of malignant melanoma of skin: Secondary | ICD-10-CM | POA: Diagnosis not present

## 2020-11-15 DIAGNOSIS — Z85828 Personal history of other malignant neoplasm of skin: Secondary | ICD-10-CM | POA: Diagnosis not present

## 2021-09-15 DIAGNOSIS — Z1231 Encounter for screening mammogram for malignant neoplasm of breast: Secondary | ICD-10-CM | POA: Diagnosis not present

## 2021-09-15 DIAGNOSIS — Z803 Family history of malignant neoplasm of breast: Secondary | ICD-10-CM | POA: Diagnosis not present

## 2021-09-15 DIAGNOSIS — Z8673 Personal history of transient ischemic attack (TIA), and cerebral infarction without residual deficits: Secondary | ICD-10-CM | POA: Diagnosis not present

## 2021-09-15 DIAGNOSIS — Z836 Family history of other diseases of the respiratory system: Secondary | ICD-10-CM | POA: Diagnosis not present

## 2021-09-15 DIAGNOSIS — M5136 Other intervertebral disc degeneration, lumbar region: Secondary | ICD-10-CM | POA: Diagnosis not present

## 2021-09-15 DIAGNOSIS — E782 Mixed hyperlipidemia: Secondary | ICD-10-CM | POA: Diagnosis not present

## 2021-09-15 DIAGNOSIS — Z9049 Acquired absence of other specified parts of digestive tract: Secondary | ICD-10-CM | POA: Diagnosis not present

## 2021-09-15 DIAGNOSIS — Z833 Family history of diabetes mellitus: Secondary | ICD-10-CM | POA: Diagnosis not present

## 2021-09-15 DIAGNOSIS — M5030 Other cervical disc degeneration, unspecified cervical region: Secondary | ICD-10-CM | POA: Diagnosis not present

## 2021-09-15 DIAGNOSIS — Z85820 Personal history of malignant melanoma of skin: Secondary | ICD-10-CM | POA: Diagnosis not present

## 2021-09-15 DIAGNOSIS — M791 Myalgia, unspecified site: Secondary | ICD-10-CM | POA: Diagnosis not present

## 2021-09-15 DIAGNOSIS — R5381 Other malaise: Secondary | ICD-10-CM | POA: Diagnosis not present

## 2021-09-15 DIAGNOSIS — I6521 Occlusion and stenosis of right carotid artery: Secondary | ICD-10-CM | POA: Diagnosis not present

## 2021-09-15 DIAGNOSIS — Z79899 Other long term (current) drug therapy: Secondary | ICD-10-CM | POA: Diagnosis not present

## 2021-09-15 DIAGNOSIS — G4700 Insomnia, unspecified: Secondary | ICD-10-CM | POA: Diagnosis not present

## 2021-09-15 DIAGNOSIS — Z800 Family history of malignant neoplasm of digestive organs: Secondary | ICD-10-CM | POA: Diagnosis not present

## 2021-09-15 DIAGNOSIS — Z0001 Encounter for general adult medical examination with abnormal findings: Secondary | ICD-10-CM | POA: Diagnosis not present

## 2021-09-15 DIAGNOSIS — F419 Anxiety disorder, unspecified: Secondary | ICD-10-CM | POA: Diagnosis not present

## 2021-09-15 DIAGNOSIS — M8589 Other specified disorders of bone density and structure, multiple sites: Secondary | ICD-10-CM | POA: Diagnosis not present

## 2021-09-15 DIAGNOSIS — Z87891 Personal history of nicotine dependence: Secondary | ICD-10-CM | POA: Diagnosis not present

## 2021-09-15 DIAGNOSIS — Z0000 Encounter for general adult medical examination without abnormal findings: Secondary | ICD-10-CM | POA: Diagnosis not present

## 2021-09-15 DIAGNOSIS — R5383 Other fatigue: Secondary | ICD-10-CM | POA: Diagnosis not present

## 2021-09-15 DIAGNOSIS — K2100 Gastro-esophageal reflux disease with esophagitis, without bleeding: Secondary | ICD-10-CM | POA: Diagnosis not present

## 2021-09-15 DIAGNOSIS — Z733 Stress, not elsewhere classified: Secondary | ICD-10-CM | POA: Diagnosis not present

## 2021-09-15 DIAGNOSIS — I10 Essential (primary) hypertension: Secondary | ICD-10-CM | POA: Diagnosis not present

## 2021-09-15 DIAGNOSIS — Z8049 Family history of malignant neoplasm of other genital organs: Secondary | ICD-10-CM | POA: Diagnosis not present

## 2021-09-15 DIAGNOSIS — Z780 Asymptomatic menopausal state: Secondary | ICD-10-CM | POA: Diagnosis not present

## 2023-05-15 ENCOUNTER — Ambulatory Visit: Payer: PPO | Admitting: Gastroenterology

## 2023-05-17 ENCOUNTER — Telehealth: Payer: Self-pay | Admitting: Gastroenterology

## 2023-05-17 NOTE — Telephone Encounter (Signed)
Inbound call from patient stating she is returning a phone call from yesterday. States she could not clearly understand voicemail that was left. Please advise, thank you.

## 2023-05-17 NOTE — Telephone Encounter (Signed)
Returned patient's call & she said she has received two phone calls from what she believes is our office, but the voicemail was difficult to understand. I've reviewed her chart & I do not see any documentation where our office has contacted her, except for in May of this year when she was scheduled for a follow up appointment in August. Pt to call back with any further questions & attend follow up as planned.

## 2023-07-04 ENCOUNTER — Ambulatory Visit: Payer: PPO | Admitting: Gastroenterology

## 2023-07-04 ENCOUNTER — Other Ambulatory Visit (INDEPENDENT_AMBULATORY_CARE_PROVIDER_SITE_OTHER): Payer: PPO

## 2023-07-04 ENCOUNTER — Encounter: Payer: Self-pay | Admitting: Gastroenterology

## 2023-07-04 VITALS — BP 146/90 | HR 65 | Ht 62.0 in | Wt 140.0 lb

## 2023-07-04 DIAGNOSIS — R1030 Lower abdominal pain, unspecified: Secondary | ICD-10-CM

## 2023-07-04 DIAGNOSIS — K625 Hemorrhage of anus and rectum: Secondary | ICD-10-CM | POA: Diagnosis not present

## 2023-07-04 DIAGNOSIS — K580 Irritable bowel syndrome with diarrhea: Secondary | ICD-10-CM | POA: Diagnosis not present

## 2023-07-04 DIAGNOSIS — Z86010 Personal history of colonic polyps: Secondary | ICD-10-CM

## 2023-07-04 LAB — CBC WITH DIFFERENTIAL/PLATELET
Basophils Absolute: 0.1 10*3/uL (ref 0.0–0.1)
Basophils Relative: 1 % (ref 0.0–3.0)
Eosinophils Absolute: 0.1 10*3/uL (ref 0.0–0.7)
Eosinophils Relative: 2.1 % (ref 0.0–5.0)
HCT: 40.9 % (ref 36.0–46.0)
Hemoglobin: 13.7 g/dL (ref 12.0–15.0)
Lymphocytes Relative: 26.3 % (ref 12.0–46.0)
Lymphs Abs: 1.5 10*3/uL (ref 0.7–4.0)
MCHC: 33.6 g/dL (ref 30.0–36.0)
MCV: 88.8 fl (ref 78.0–100.0)
Monocytes Absolute: 0.3 10*3/uL (ref 0.1–1.0)
Monocytes Relative: 5.8 % (ref 3.0–12.0)
Neutro Abs: 3.7 10*3/uL (ref 1.4–7.7)
Neutrophils Relative %: 64.8 % (ref 43.0–77.0)
Platelets: 311 10*3/uL (ref 150.0–400.0)
RBC: 4.6 Mil/uL (ref 3.87–5.11)
RDW: 13.3 % (ref 11.5–15.5)
WBC: 5.7 10*3/uL (ref 4.0–10.5)

## 2023-07-04 LAB — COMPREHENSIVE METABOLIC PANEL
ALT: 17 U/L (ref 0–35)
AST: 21 U/L (ref 0–37)
Albumin: 4.3 g/dL (ref 3.5–5.2)
Alkaline Phosphatase: 71 U/L (ref 39–117)
BUN: 15 mg/dL (ref 6–23)
CO2: 31 mEq/L (ref 19–32)
Calcium: 10.1 mg/dL (ref 8.4–10.5)
Chloride: 102 mEq/L (ref 96–112)
Creatinine, Ser: 0.74 mg/dL (ref 0.40–1.20)
GFR: 83.77 mL/min (ref >60.00)
Glucose, Bld: 97 mg/dL (ref 70–99)
Potassium: 4.8 mEq/L (ref 3.5–5.1)
Sodium: 140 mEq/L (ref 135–145)
Total Bilirubin: 0.9 mg/dL (ref 0.2–1.2)
Total Protein: 6.8 g/dL (ref 6.0–8.3)

## 2023-07-04 MED ORDER — HYDROCORTISONE (PERIANAL) 2.5 % EX CREA: 1.0000 | TOPICAL_CREAM | Freq: Two times a day (BID) | CUTANEOUS | 1 refills | Status: AC

## 2023-07-04 NOTE — Progress Notes (Signed)
Chief Complaint: FU  Referring Provider:  Gordan Payment., MD      ASSESSMENT AND PLAN;   #1.  Lower abdo pain   #2.  IBS-D with occ rectal bleeding. Nl CBC. Has associated post-cholecystectomy diarrhea  #3.  Previous history of colonic polyps.  Plan: -Benefiber 1 tsp po every day with 4 oz H2O when diarrhea and 8 oz water when constipated. -Prep-H supp BID x 7 days -Anusol HC cream BID x 10 days after -Kegel's exercises -CT AP with contrast (in [place]) -CBC, CMP -Continue omeprazole 40 mg p.o. every morning. -FU if still with problems   HPI:    Kathleen Mayo is a 67 y.o. female   For FU  Occ rectal bleeding-mostly bright red, away from the stool.  Recent colonoscopy 04/2020 did show internal hemorrhoids as below With lower abdominal crampy pain Has to have multiple BMs in the morning with sensation of incomplete evacuation Occasional diarrhea alternating with constipation.  Has been having more constipation lately. Has to lean back to have bowel movements  Longstanding urinary incontinence despite bladder tack in past. Dx with stress incontinence.  No further dysphagia after dilation.  Denies having any upper GI problems.    Past GI procedures:  Colon 05/10/2020 - One 6 mm polyp in the proximal ascending colon, removed with a cold snare. Resected and retrieved. - Minimal sigmoid diverticulosis. - Non-bleeding internal hemorrhoids. - Bx; TA - Rpt 7 yrs   EGD 05/10/2020 - Mild Schatzki ring. Biopsied. Dilated 50 Fr. - Small hiatal hernia. - Gastritis. Biopsied. - Two gastric polyps. Resected and retrieved. - Normal examined duodenum. - Neg Bx for HP.  Esophageal biopsies did show mild reflux.  Fundic gland polyps.  -Colon 05/2014 ([facility]-some difficulty through the sigmoid colon) mild sigmoid diverticulosis otherwise normal to TI. 2010-tubular adenomas. -EGD 09/2007 Schatzki's ring s/p dil  50Fr, mild gastritis, irregular Z-line. Neg Bx for  EoE/HP  SH-taking care of elderly relative with dementia.  Past Medical History:  Diagnosis Date   Anxiety    ptsd   Arthritis    Basal cell carcinoma    CAD (coronary artery disease)    DDD (degenerative disc disease), cervical    DDD (degenerative disc disease), lumbar    Dysphagia    Essential hypertension    GERD (gastroesophageal reflux disease)    History of colon polyps    History of TIA (transient ischemic attack)    IBS (irritable bowel syndrome)    Mixed hyperlipidemia    Nephritis    as a child   Osteopenia    Osteopenia of multiple sites    Osteoporosis    osteopenia   Schatzki's ring    Seizures (HCC)    1- Grand mal seizure early 2000's   Uterine cancer (HCC)     Past Surgical History:  Procedure Laterality Date   ABDOMINAL HYSTERECTOMY     age 41   BACK SURGERY     2 protruding disc and 6 spurs   BLADDER SURGERY     x2   CHOLECYSTECTOMY     COLONOSCOPY  06/11/2014   Mild diverticulosis. Otherwise normal colonoscopy   ESOPHAGOGASTRODUODENOSCOPY  10/14/2007   Sxhatzki ring status post esophageal dilatation. Mildy irregular Z-Line suggestive of gastroesophageal reflux (biopsied). Mild gastritis.    MELANOMA EXCISION Left    left upper leg x 2   NECK SURGERY  2012   has a metal plate in neck with scar tissue    Family  History  Problem Relation Age of Onset   Breast cancer Mother        2 different kinds per patient   Pancreatic cancer Cousin    Colon cancer Cousin    Esophageal cancer Neg Hx    Colon polyps Neg Hx    Stomach cancer Neg Hx     Social History   Tobacco Use   Smoking status: Former   Smokeless tobacco: Never  Vaping Use   Vaping status: Never Used  Substance Use Topics   Alcohol use: Not Currently   Drug use: Not Currently    Current Outpatient Medications  Medication Sig Dispense Refill   Ascorbic Acid (VITAMIN C PO) Take 1 tablet by mouth daily.     BIOTIN PO Take 1 tablet by mouth daily.     calcium carbonate  (OSCAL) 1500 (600 Ca) MG TABS tablet Take 1,200 mg of elemental calcium by mouth daily.     Cholecalciferol (VITAMIN D) 50 MCG (2000 UT) tablet Take 2,000 Units by mouth daily.     Homeopathic Products (ZICAM COLD REMEDY PO) Take by mouth.     hyoscyamine (LEVSIN SL) 0.125 MG SL tablet Take 1 tablet by mouth every 4 (four) hours as needed.     losartan (COZAAR) 25 MG tablet 1 tablet daily.     Multiple Vitamin (MULTIVITAMIN) capsule Take 1 capsule by mouth daily.     Multiple Vitamins-Minerals (ICAPS) CAPS Take 1 capsule by mouth daily.     omeprazole (PRILOSEC) 40 MG capsule Take 40 mg by mouth daily.     simvastatin (ZOCOR) 10 MG tablet 1 tablet as directed. 1 to 3 times a week     No current facility-administered medications for this visit.    Allergies  Allergen Reactions   Sulfamethoxazole Anaphylaxis    Review of Systems:  Psychiatric/Behavioral: Has anxiety or depression     Physical Exam:    BP (!) 142/90   Pulse 65   Ht 5\' 2"  (1.575 m)   Wt 140 lb (63.5 kg)   SpO2 98%   BMI 25.61 kg/m  Wt Readings from Last 3 Encounters:  07/04/23 140 lb (63.5 kg)  05/10/20 144 lb (65.3 kg)  04/22/20 144 lb (65.3 kg)   Constitutional:  Well-developed, in no acute distress. Psychiatric: Normal mood and affect. Behavior is normal. HEENT: Pupils normal.  Conjunctivae are normal. No scleral icterus. Cardiovascular: Normal rate, regular rhythm. No edema Pulmonary/chest: Effort normal and bilateral breath sounds are reduced.. No wheezing, rales or rhonchi. Abdominal: Soft, nondistended. Nontender. Bowel sounds active throughout. There are no masses palpable. No hepatomegaly. Rectal: In presence of [name].  Small internal hemorrhoids.  Good rectal tone. ?  Rectocele.  Brown heme-negative stools. Neurological: Alert and oriented to person place and time. Skin: Skin is warm and dry. No rashes noted.     Edman Circle, MD 07/04/2023, 1:32 PM  Cc: Gordan Payment., MD

## 2023-07-04 NOTE — Patient Instructions (Addendum)
_______________________________________________________  If your blood pressure at your visit was 140/90 or greater, please contact your primary care physician to follow up on this.  _______________________________________________________  If you are age 67 or older, your body mass index should be between 23-30. Your Body mass index is 25.61 kg/m. If this is out of the aforementioned range listed, please consider follow up with your Primary Care Provider.  If you are age 58 or younger, your body mass index should be between 19-25. Your Body mass index is 25.61 kg/m. If this is out of the aformentioned range listed, please consider follow up with your Primary Care Provider.   ________________________________________________________  The [place] [facility] providers would like to encourage you to use [facility] to communicate with providers for non-urgent requests or questions.  Due to long hold times on the telephone, sending your provider a message by [facility] may be a faster and more efficient way to get a response.  Please allow 48 business hours for a response.  Please remember that this is for non-urgent requests.  _______________________________________________________  Your provider has requested that you go to the basement level for lab work before leaving today. Press "B" on the elevator. The lab is located at the first door on the left as you exit the elevator.  Please purchase the following medications over the counter and take as directed: Benefiber 1 teaspoon in 4oz of water when having diarrhea and 8oz of water when constipated Preparation H supp 2 times a day for 7 days  We have sent the following medications to your pharmacy for you to pick up at your convenience: Anusol cream. Use after the preparation H supp  Continue Omeprazole and Pepcid  Call in 1 week regarding CT at [facility] if you haven't heard from [place].   Kegel Exercises  Kegel exercises can help strengthen your pelvic floor  muscles. The pelvic floor is a group of muscles that support your rectum, small intestine, and bladder. In females, pelvic floor muscles also help support the uterus. These muscles help you control the flow of urine and stool (feces). Kegel exercises are painless and simple. They do not require any equipment. Your provider may suggest Kegel exercises to: Improve bladder and bowel control. Improve sexual response. Improve weak pelvic floor muscles after surgery to remove the uterus (hysterectomy) or after pregnancy, in females. Improve weak pelvic floor muscles after prostate gland removal or surgery, in males. Kegel exercises involve squeezing your pelvic floor muscles. These are the same muscles you squeeze when you try to stop the flow of urine or keep from passing gas. The exercises can be done while sitting, standing, or lying down, but it is best to vary your position. Ask your health care provider which exercises are safe for you. Do exercises exactly as told by your health care provider and adjust them as directed. Do not begin these exercises until told by your health care provider. Exercises How to do Kegel exercises: Squeeze your pelvic floor muscles tight. You should feel a tight lift in your rectal area. If you are a female, you should also feel a tightness in your vaginal area. Keep your stomach, buttocks, and legs relaxed. Hold the muscles tight for up to 10 seconds. Breathe normally. Relax your muscles for up to 10 seconds. Repeat as told by your health care provider. Repeat this exercise daily as told by your health care provider. Continue to do this exercise for at least 4-6 weeks, or for as long as told by  your health care provider. You may be referred to a physical therapist who can help you learn more about how to do Kegel exercises. Depending on your condition, your health care provider may recommend: Varying how long you squeeze your muscles. Doing several sets of exercises  every day. Doing exercises for several weeks. Making Kegel exercises a part of your regular exercise routine. This information is not intended to replace advice given to you by your health care provider. Make sure you discuss any questions you have with your health care provider. Document Revised: 03/24/2021 Document Reviewed: 03/24/2021 Elsevier Patient Education  2024 [employer]  Thank you,  Dr. Lynann Bologna

## 2023-07-05 ENCOUNTER — Telehealth: Payer: Self-pay

## 2023-07-05 NOTE — Telephone Encounter (Signed)
Called patient and let her know that her CT has been faxed to [facility] and she can call them to schedule it. Gave her the number to call and she voiced understanding  CT per insurance coordinator said that no auth needed for her CT and sent her CMP results to [facility] as well with order

## 2023-11-16 ENCOUNTER — Telehealth: Payer: Self-pay

## 2023-11-16 NOTE — Telephone Encounter (Signed)
CT came back negative for any acute abnormalitis. LVM

## 2023-11-16 NOTE — Telephone Encounter (Signed)
Made made aware and voiced understanding

## 2023-12-03 ENCOUNTER — Encounter: Payer: Self-pay | Admitting: Gastroenterology

## 2024-10-16 ENCOUNTER — Encounter: Payer: Self-pay | Admitting: Genetic Counselor

## 2024-12-31 NOTE — Progress Notes (Unsigned)
 REFERRING PROVIDER: Thurmond Cathlyn LABOR., MD [address] [place],  [place] [address]  PRIMARY PROVIDER:  Thurmond Cathlyn LABOR., MD  PRIMARY REASON FOR VISIT:  No diagnosis found.  HISTORY OF PRESENT ILLNESS:   Gerlene Glassburn, a 69 y.o. female, was seen for a [place] cancer genetics consultation at the request of Kee, MD*** due to a {Personal/family:20331} history of {cancer/polyps}.  Elizbeth Posa presents to clinic today to discuss the possibility of a hereditary predisposition to cancer, genetic testing, and to further clarify her future cancer risks, as well as potential cancer risks for family members.   ***presents today the at [facility] to discuss the possibility of a hereditary predisposition to cancer, genetic testing, and to further clarify her future cancer risks, as well as potential cancer risks for family members.  Diagnosis: In ***, at the age of ***, Meilin Brosh was diagnosed with {CA PATHOLOGY:63853} of the {right left (wildcard):15202} {CA NMHJW:36145}. The treatment plan includes***.    *** Deaun Rocha is a 69 y.o. female with no personal history of cancer.    CANCER HISTORY:  Oncology History   No problem history exists.    RISK FACTORS:  Menarche was at age ***.  First live birth at age ***.  OCP use for approximately {Numbers 1-12 multi-select:20307} years.  Oophorectomy: {Yes/No-Ex:120004}.  Hysterectomy: {Yes/No-Ex:120004}.  Menopausal status: {Menopause:31378}.  HRT use: {Numbers 1-12 multi-select:20307} years. Colonoscopy: {Yes/No-Ex:120004}; {normal/abnormal/not examined:14677}. Mammogram within the last year: {Yes/No-Ex:120004}. Number of breast biopsies: {Numbers 1-12 multi-select:20307}. Up to date with pelvic exams: {Yes/No-Ex:120004}. Any excessive radiation exposure or other environmental exposures the past: {Yes/No-Ex:120004} Tobacco Use: ***Current***Former***Never  Past Medical History:  Diagnosis Date    Anxiety    ptsd   Arthritis    Basal cell carcinoma    CAD (coronary artery disease)    DDD (degenerative disc disease), cervical    DDD (degenerative disc disease), lumbar    Dysphagia    Essential hypertension    GERD (gastroesophageal reflux disease)    History of colon polyps    History of TIA (transient ischemic attack)    IBS (irritable bowel syndrome)    Mixed hyperlipidemia    Nephritis    as a child   Osteopenia    Osteopenia of multiple sites    Osteoporosis    osteopenia   Schatzki's ring    Seizures (HCC)    1- Grand mal seizure early 2000's   Uterine cancer (HCC)    Past Surgical History:  Procedure Laterality Date   ABDOMINAL HYSTERECTOMY     age 25   BACK SURGERY     2 protruding disc and 6 spurs   BLADDER SURGERY     x2   CHOLECYSTECTOMY     COLONOSCOPY  06/11/2014   Mild diverticulosis. Otherwise normal colonoscopy   ESOPHAGOGASTRODUODENOSCOPY  10/14/2007   Sxhatzki ring status post esophageal dilatation. Mildy irregular Z-Line suggestive of gastroesophageal reflux (biopsied). Mild gastritis.    MELANOMA EXCISION Left    left upper leg x 2   NECK SURGERY  2012   has a metal plate in neck with scar tissue   Social History   Socioeconomic History   Marital status: Divorced    Spouse name: Not on file   Number of children: 2   Years of education: Not on file   Highest education level: Not on file  Occupational History   Not on file  Tobacco Use   Smoking status: Former   Smokeless tobacco:  Never  Vaping Use   Vaping status: Never Used  Substance and Sexual Activity   Alcohol use: Not Currently   Drug use: Not Currently   Sexual activity: Not on file  Other Topics Concern   Not on file  Social History Narrative   Not on file   Social Drivers of Health   Tobacco Use: Medium Risk (10/16/2024)   Patient History    Smoking Tobacco Use: Former    Smokeless Tobacco Use: Never    Passive Exposure: Not on Actuary Strain:  Not on file  Food Insecurity: Low Risk (06/20/2024)   Received from [facility]   Epic    Within the past 12 months, you worried that your food would run out before you got money to buy more: Never true    Within the past 12 months, the food you bought just didn't last and you didn't have money to get more. : Never true  Transportation Needs: No Transportation Needs (06/20/2024)   Received from [facility]    In the past 12 months, has lack of reliable transportation kept you from medical appointments, meetings, work or from getting things needed for daily living? : No  Physical Activity: Not on file  Stress: Not on file  Social Connections: Not on file  Depression (EYV7-0): Not on file  Alcohol Screen: Not on file  Housing: Low Risk (06/20/2024)   Received from [facility]   Epic    What is your living situation today?: I have a steady place to live    Think about the place you live. Do you have problems with any of the following? Choose all that apply:: None/None on this list  Utilities: Low Risk (06/20/2024)   Received from [facility]   Utilities    In the past 12 months has the electric, gas, oil, or water company threatened to shut off services in your home? : No  Health Literacy: Not on file    FAMILY HISTORY:  We obtained a detailed, 4-generation family history pasted below.   Feather Berrie is ***aware ***unaware of relatives completing genetic testing for hereditary cancer risks.  ***Patient's maternal ancestors are of *** descent, and paternal ancestors are of *** descent.  There {IS NO:12509} reported Ashkenazi Jewish ancestry.  There ***is no known consanguinity.  Pedigree Summary*** Significant diagnoses are listed below: Family History  Problem Relation Age of Onset   Breast cancer Mother 45       2 different kinds per patient   Pancreatic cancer Cousin 55   Breast cancer Maternal Aunt 90       bilateral breast cancer   Esophageal cancer  Neg Hx    Colon polyps Neg Hx    Stomach cancer Neg Hx     GENETIC COUNSELING ASSESSMENT: Anayely Constantine is a 69 y.o. female with a {Personal/family:20331} history of {cancer/polyps} which is somewhat suggestive of a hereditary cancer predisposition syndrome*** given ***. We, therefore, discussed and recommended the following at today's visit.   DISCUSSION: We discussed that, in general, most cancer is not inherited in families, but instead is sporadic or familial. Sporadic cancers occur by chance and typically happen at older ages (>50 years) as this type of cancer is caused by genetic changes acquired during an individuals lifetime. Some families have more cancers than would be expected by chance; however, the ages or types of cancer are not consistent with a known genetic mutation or known genetic mutations have  been ruled out. This type of familial cancer is thought to be due to a combination of multiple genetic, environmental, hormonal, and lifestyle factors. While this combination of factors likely increases the risk of cancer, the exact source of this risk is not currently identifiable or testable.  We discussed that 5-10% of cancer is the result of germline (heritable) genetic variants, with most cases associated with ***BRCA1/BRCA2. There are other genes that can be associated with hereditary *** cancer syndromes. These include ***. We discussed that testing is beneficial for several reasons including knowing how to follow individuals after completing their treatment, identifying whether potential treatment options ***such as PARP inhibitors*** would be beneficial, and understanding if other family members could be at risk for cancer and allow them to undergo genetic testing.   We reviewed the characteristics, features and inheritance patterns of hereditary cancer syndromes. We also discussed genetic testing, including the appropriate family members to test, the process of testing, insurance  coverage and turn-around-time for results. We discussed the implications of a negative, positive, carrier and/or variant of uncertain significant result. Shacoria Latif  was offered a common hereditary cancer panel (***40 ***48 genes) and an expanded pan-cancer panel (***70***77 genes). Anmarie Fukushima was informed of the benefits and limitations of each panel, including that expanded pan-cancer panels contain genes that do not have clear management guidelines at this point in time.  We also discussed that as the number of genes included on a panel increases, the chances of variants of uncertain significance increases.  ***GENETIC TESTING NATIONAL CRITERIA: Based on Nikie Jaquay's {Personal/family:20331} history of cancer she meets medical criteria for genetic testing based on the [employer] (NCCN) guidelines. ***Though Laekyn Rayos is not personally affected, there are no affected family members that are willing/able/available to undergo hereditary cancer testing. Therefore, Shakeya Kerkman the most informative family member available. ***Despite that she meets criteria, she may still have an out of pocket cost.  ***Despite that she meets criteria, she  may still have an out of pocket cost. she completed the [facility] patient assistance form in the office and qualified for $0 out of post cost. We discussed that if her out of pocket cost for testing is over $100, the laboratory will send a text with the estimated out-of-pocket cost.  If the out of pocket cost of testing is less than $100 she will be billed by the genetic testing laboratory.   GENETIC TESTING CONSENT:  After considering the risks, benefits, and limitations, Reena Kilts ***did NOT provide***provided informed consent to pursue genetic testing. A blood sample was sent to [facility] for analysis of the ***CancerNext-Expanded+RNA Panel. Results should be available within approximately ***2-3 weeks' time, at  which point they will be disclosed by telephone to [facility] , as will any additional recommendations warranted by these results. Sundai Probert will receive a summary of her genetic counseling visit and a copy of her results once available. This information will also be available in Epic.  ***{INSERT}.jrcambry ***{INSERT} .jrcinvit  ***DOES NOT MEET NCCN CRITERIA ***We discussed with Reena Kilts that the {Personal/family:20331} history does not meet insurance or NCCN criteria for genetic testing and, therefore, is not highly consistent with a familial hereditary cancer syndrome.  We feel she is at low risk to harbor  a gene mutation associated with such a condition. Thus, we did not recommend any genetic testing, at this time, and recommended Dalexa Gentz continue to follow the cancer screening guidelines given by her primary healthcare provider.  ***STAT  TESTING ***We reviewed the characteristics, features and inheritance patterns of hereditary cancer syndromes. We also discussed genetic testing, including the appropriate family members to test, the process of testing, insurance coverage and turn-around-time for results. We discussed the implications of a negative, positive and/or variant of uncertain significant result. In order to get genetic test results in a timely manner so that Reena Kilts can use these genetic test results for surgical decisions, we recommended Laryah Neuser pursue genetic testing for the ***. Once complete, we recommend Summit Borchardt pursue reflex genetic testing to the *** gene panel.   GENETIC INFORMATION NONDISCRIMINATION ACT (GINA): We discussed that some people do not want to undergo genetic testing due to fear of genetic discrimination.  The Genetic Information Nondiscrimination Act (GINA) was signed into federal law in 2008. GINA prohibits health insurers and most employers from discriminating against individuals based on genetic information (including  the results of genetic tests and family history information). According to GINA, health insurance companies cannot consider genetic information to be a preexisting condition, nor can they use it to make decisions regarding coverage or rates. GINA also makes it illegal for most employers to use genetic information in making decisions about hiring, firing, promotion, or terms of employment. It is important to note that GINA does not offer protections for life insurance, disability insurance, or long-term care insurance. GINA does not apply to those in the [employer], those who work for companies with less than 15 employees, and new life insurance or long-term disability insurance policies.  Health status due to a cancer diagnosis is not protected under GINA. More information about GINA can be found by visiting [email or web address].be. Lastly, we encouraged Tessla Spurling to remain in contact with cancer genetics annually so that we can continuously update the family history and inform her of any changes in cancer genetics and testing that may be of benefit for this family.   Izella Capobianco's questions were answered to her satisfaction today. Our contact information was provided should additional questions or concerns arise. Thank you for the referral and allowing us  to share in the care of your patient.   Resources:  Jahayra Mazo was provided with the following:  ***[facility] Billing information  ***[facility] Hereditary Cancer Testing Patient Guide ***[facility] Patient Assistance Information Sheet ***[facility] CancerNext-Expanded + RNAinsight gene list  PLAN:  ***Testing Ordered: ***Clinic Note Faxed/Routed to Reena Avallone's PCP ***Thurmond Cathlyn LABOR., MD  ***Vaughn Banker Patient Assistance completed during visit (OOP ***$0) ***Declined Testing *** Despite our recommendation, Kinnley Paulson did not wish to pursue genetic testing at today's visit. We understand this decision and remain  available to coordinate genetic testing at any time in the future. We, therefore, recommend Rona Tomson continue to follow the cancer screening guidelines given by her primary healthcare provider.  ***MOST INFORMATIVE PERSON TO TEST ***Based on Arlet Dudenhoeffer's family history, we recommended her ***, who was diagnosed with *** at age ***, have genetic counseling and testing. Shade Rivenbark will let us  know if we can be of any assistance in coordinating genetic counseling and/or testing for this family member.   Santana Fryer, MS, CGC  Certified Genetic Counselor  Email: [email or web address]  Phone: [phone]  I personally spent a total of *** minutes in the care of the patient today including {Time Based Coding:210964241}.  *** The patient was seen alone.  ***The patient was joined by ***. Drs. Lanny Stalls, and/or Gudena were available for questions, if needed. _______________________________________________________________________ For Office Staff:  Number of people  involved in session: *** Was an Intern/ student involved with case: {YES/NO:63}

## 2025-01-01 DIAGNOSIS — C55 Malignant neoplasm of uterus, part unspecified: Secondary | ICD-10-CM | POA: Insufficient documentation

## 2025-01-01 DIAGNOSIS — C439 Malignant melanoma of skin, unspecified: Secondary | ICD-10-CM | POA: Insufficient documentation

## 2025-01-01 DIAGNOSIS — C23 Malignant neoplasm of gallbladder: Secondary | ICD-10-CM | POA: Insufficient documentation

## 2025-01-02 ENCOUNTER — Telehealth: Payer: Self-pay

## 2025-01-02 ENCOUNTER — Inpatient Hospital Stay

## 2025-01-02 DIAGNOSIS — C439 Malignant melanoma of skin, unspecified: Secondary | ICD-10-CM

## 2025-01-02 DIAGNOSIS — C23 Malignant neoplasm of gallbladder: Secondary | ICD-10-CM

## 2025-01-02 DIAGNOSIS — C55 Malignant neoplasm of uterus, part unspecified: Secondary | ICD-10-CM

## 2025-01-02 NOTE — Telephone Encounter (Signed)
 I attempted to contact Kathleen Mayo to check in with her about her genetic counseling appointment this morning. I was unable to reach her and message was left requesting a call back to reschedule her visit.   Santana Fryer, MS, CGC  Certified Genetic Counselor  Email: [email or web address]  Phone: [phone]
